# Patient Record
Sex: Male | Born: 1940 | Race: White | Hispanic: No | Marital: Married | Smoking: Never smoker
Health system: Southern US, Community
[De-identification: ages and names within clinical notes are randomized; demographics above are authoritative.]

---

## 2000-04-17 ENCOUNTER — Encounter: Payer: Self-pay | Admitting: Family Medicine

## 2000-04-17 ENCOUNTER — Encounter: Admission: RE | Admit: 2000-04-17 | Discharge: 2000-04-17 | Payer: Self-pay | Admitting: Family Medicine

## 2000-11-12 ENCOUNTER — Ambulatory Visit (HOSPITAL_COMMUNITY): Admission: RE | Admit: 2000-11-12 | Discharge: 2000-11-12 | Payer: Self-pay | Admitting: Gastroenterology

## 2000-11-12 ENCOUNTER — Encounter (INDEPENDENT_AMBULATORY_CARE_PROVIDER_SITE_OTHER): Payer: Self-pay | Admitting: Specialist

## 2007-05-12 ENCOUNTER — Encounter: Admission: RE | Admit: 2007-05-12 | Discharge: 2007-05-12 | Payer: Self-pay | Admitting: Internal Medicine

## 2010-07-07 NOTE — Op Note (Signed)
[place]  Patient:    Jared Roy, Jared Roy Visit Number: [id number] MRN: [id number]          Service Type: END Location: ENDO Attending Physician:  Charna Elizabeth Dictated by:   Anselmo Rod, M.D. Proc. Date: 11/12/00 Admit Date:  11/12/2000   CC:         Talmadge Coventry, M.D.   Operative Report  PROCEDURE:  Colonoscopy with snare polypectomy x 1.  ENDOSCOPIST:  Anselmo Rod, M.D.  INSTRUMENTS USED:  Olympus video colonoscope.  INDICATIONS:  [age]-year-old white male with a history of blood in the stool.  Rule out polyps, AVMs, hemorrhoids, etc.  INFORMED CONSENT:  Informed consent was procured from the patient.  PREPROCEDURE PREPARATION:  The patient was fasted for eight hours prior to the procedure after being prepped with a bottle of magnesium citrate and a bottle of NuLytely the night prior to the procedure.  PREPROCEDURE PHYSICAL EXAMINATION:  VITAL SIGNS:  The patient had stable vital signs.  NECK:  Supple.  CHEST:  Clear to auscultation.  HEART:  S1 and S2, regular.  ABDOMEN:  Soft with normal abdominal bowel sounds.  DESCRIPTION OF PROCEDURE:  The patient was placed in the left lateral decubitus position and sedated with 30 mg of Demerol and 5 mg of Versed intravenously.  Once the patient was adequately sedated and maintained on low flow oxygen, and continuous cardiac monitoring, the Olympus video colonoscope was advanced from the rectum to the cecum without difficulty.  A small polyp was snared at the hepatic flexure.  There were a few small scattered diverticula that seemed to be in the early stages of formation.  No other abnormalities were noted.  The procedure was completed up to the cecum.  The ileocecal valve and appendiceal orifice were clearly visualized.  IMPRESSION: 1. Small polyp snared at the hepatic flexure. 2. Few early scattered diverticula.  RECOMMENDATIONS: 1. Await pathology  results. 2. High fiber diet. 3. Outpatient follow-up in the next two weeks. Dictated by:   Anselmo Rod, M.D. Attending Physician:  Charna Elizabeth DD:  11/12/00 TD:  11/12/00 Job: [id number]

## 2010-09-09 ENCOUNTER — Emergency Department (HOSPITAL_COMMUNITY): Payer: Medicare Other

## 2010-09-09 ENCOUNTER — Emergency Department (HOSPITAL_COMMUNITY)
Admission: EM | Admit: 2010-09-09 | Discharge: 2010-09-09 | Discharge status: Against Medical Advice | Payer: Medicare Other | Attending: Emergency Medicine | Admitting: Emergency Medicine

## 2010-09-09 DIAGNOSIS — F329 Major depressive disorder, single episode, unspecified: Secondary | ICD-10-CM | POA: Insufficient documentation

## 2010-09-09 DIAGNOSIS — R0602 Shortness of breath: Secondary | ICD-10-CM | POA: Insufficient documentation

## 2010-09-09 DIAGNOSIS — E039 Hypothyroidism, unspecified: Secondary | ICD-10-CM | POA: Insufficient documentation

## 2010-09-09 DIAGNOSIS — R55 Syncope and collapse: Secondary | ICD-10-CM | POA: Insufficient documentation

## 2010-09-09 DIAGNOSIS — S1093XA Contusion of unspecified part of neck, initial encounter: Secondary | ICD-10-CM | POA: Insufficient documentation

## 2010-09-09 DIAGNOSIS — R296 Repeated falls: Secondary | ICD-10-CM | POA: Insufficient documentation

## 2010-09-09 DIAGNOSIS — R32 Unspecified urinary incontinence: Secondary | ICD-10-CM | POA: Insufficient documentation

## 2010-09-09 DIAGNOSIS — S0003XA Contusion of scalp, initial encounter: Secondary | ICD-10-CM | POA: Insufficient documentation

## 2010-09-09 DIAGNOSIS — R42 Dizziness and giddiness: Secondary | ICD-10-CM | POA: Insufficient documentation

## 2010-09-09 DIAGNOSIS — F3289 Other specified depressive episodes: Secondary | ICD-10-CM | POA: Insufficient documentation

## 2010-09-09 DIAGNOSIS — E7800 Pure hypercholesterolemia, unspecified: Secondary | ICD-10-CM | POA: Insufficient documentation

## 2010-09-09 LAB — BASIC METABOLIC PANEL
CO2: 24 mEq/L (ref 19–32)
Glucose, Bld: 91 mg/dL (ref 70–99)
Potassium: 3.8 mEq/L (ref 3.5–5.1)
Sodium: 140 mEq/L (ref 135–145)

## 2010-09-09 LAB — DIFFERENTIAL
Lymphocytes Relative: 10 % — ABNORMAL LOW (ref 12–46)
Lymphs Abs: 1.3 10*3/uL (ref 0.7–4.0)
Neutrophils Relative %: 82 % — ABNORMAL HIGH (ref 43–77)

## 2010-09-09 LAB — CBC
HCT: 37.6 % — ABNORMAL LOW (ref 39.0–52.0)
MCV: 89.3 fL (ref 78.0–100.0)
RBC: 4.21 MIL/uL — ABNORMAL LOW (ref 4.22–5.81)
WBC: 12.7 10*3/uL — ABNORMAL HIGH (ref 4.0–10.5)

## 2011-04-15 ENCOUNTER — Ambulatory Visit (INDEPENDENT_AMBULATORY_CARE_PROVIDER_SITE_OTHER): Payer: Medicare Other | Admitting: Family Medicine

## 2011-04-15 DIAGNOSIS — E785 Hyperlipidemia, unspecified: Secondary | ICD-10-CM | POA: Insufficient documentation

## 2011-04-15 DIAGNOSIS — E039 Hypothyroidism, unspecified: Secondary | ICD-10-CM | POA: Insufficient documentation

## 2011-04-15 DIAGNOSIS — J45909 Unspecified asthma, uncomplicated: Secondary | ICD-10-CM

## 2011-04-15 MED ORDER — BECLOMETHASONE DIPROPIONATE 40 MCG/ACT IN AERS: 2.0000 | INHALATION_SPRAY | Freq: Two times a day (BID) | RESPIRATORY_TRACT | Status: AC

## 2011-04-15 NOTE — Progress Notes (Signed)
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  Has had good relief in past with Q-var.  Worse x 2 weeks.  No fever.  O: NAD HEENT unremarkable Chest:  Faint wheezes  A:  Chronic persistent asthma  P: RX:  Q-var 40

## 2012-01-04 ENCOUNTER — Other Ambulatory Visit: Payer: Self-pay | Admitting: Dermatology

## 2013-01-23 IMAGING — CR DG CHEST 2V
2 series · 2 of 2 positions shown · non-contrast
Comparison: Chest x-ray 09/19/2009.

CLINICAL DATA: Shortness of breath.

CHEST - 2 VIEW

[w chest pa]
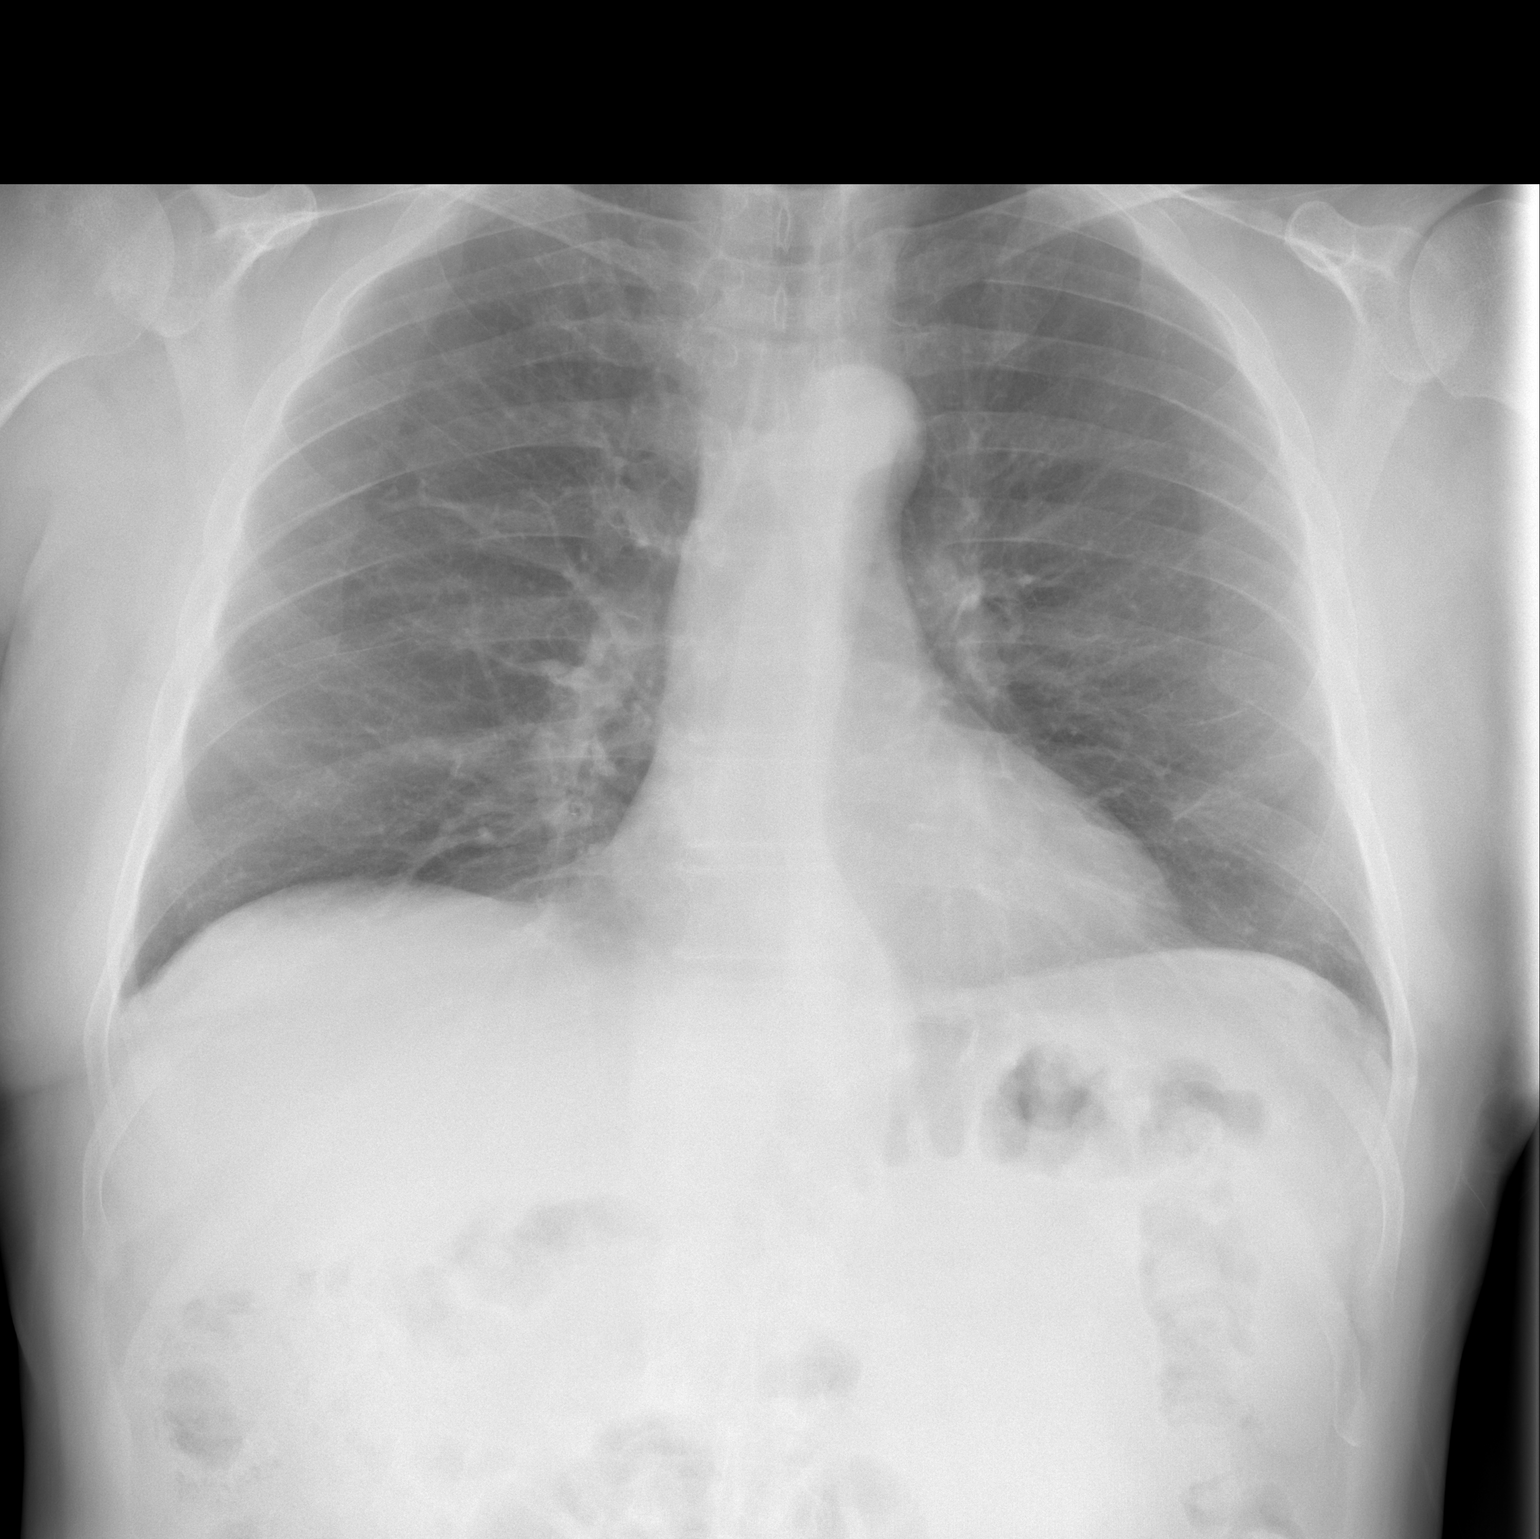

[w chest lat]
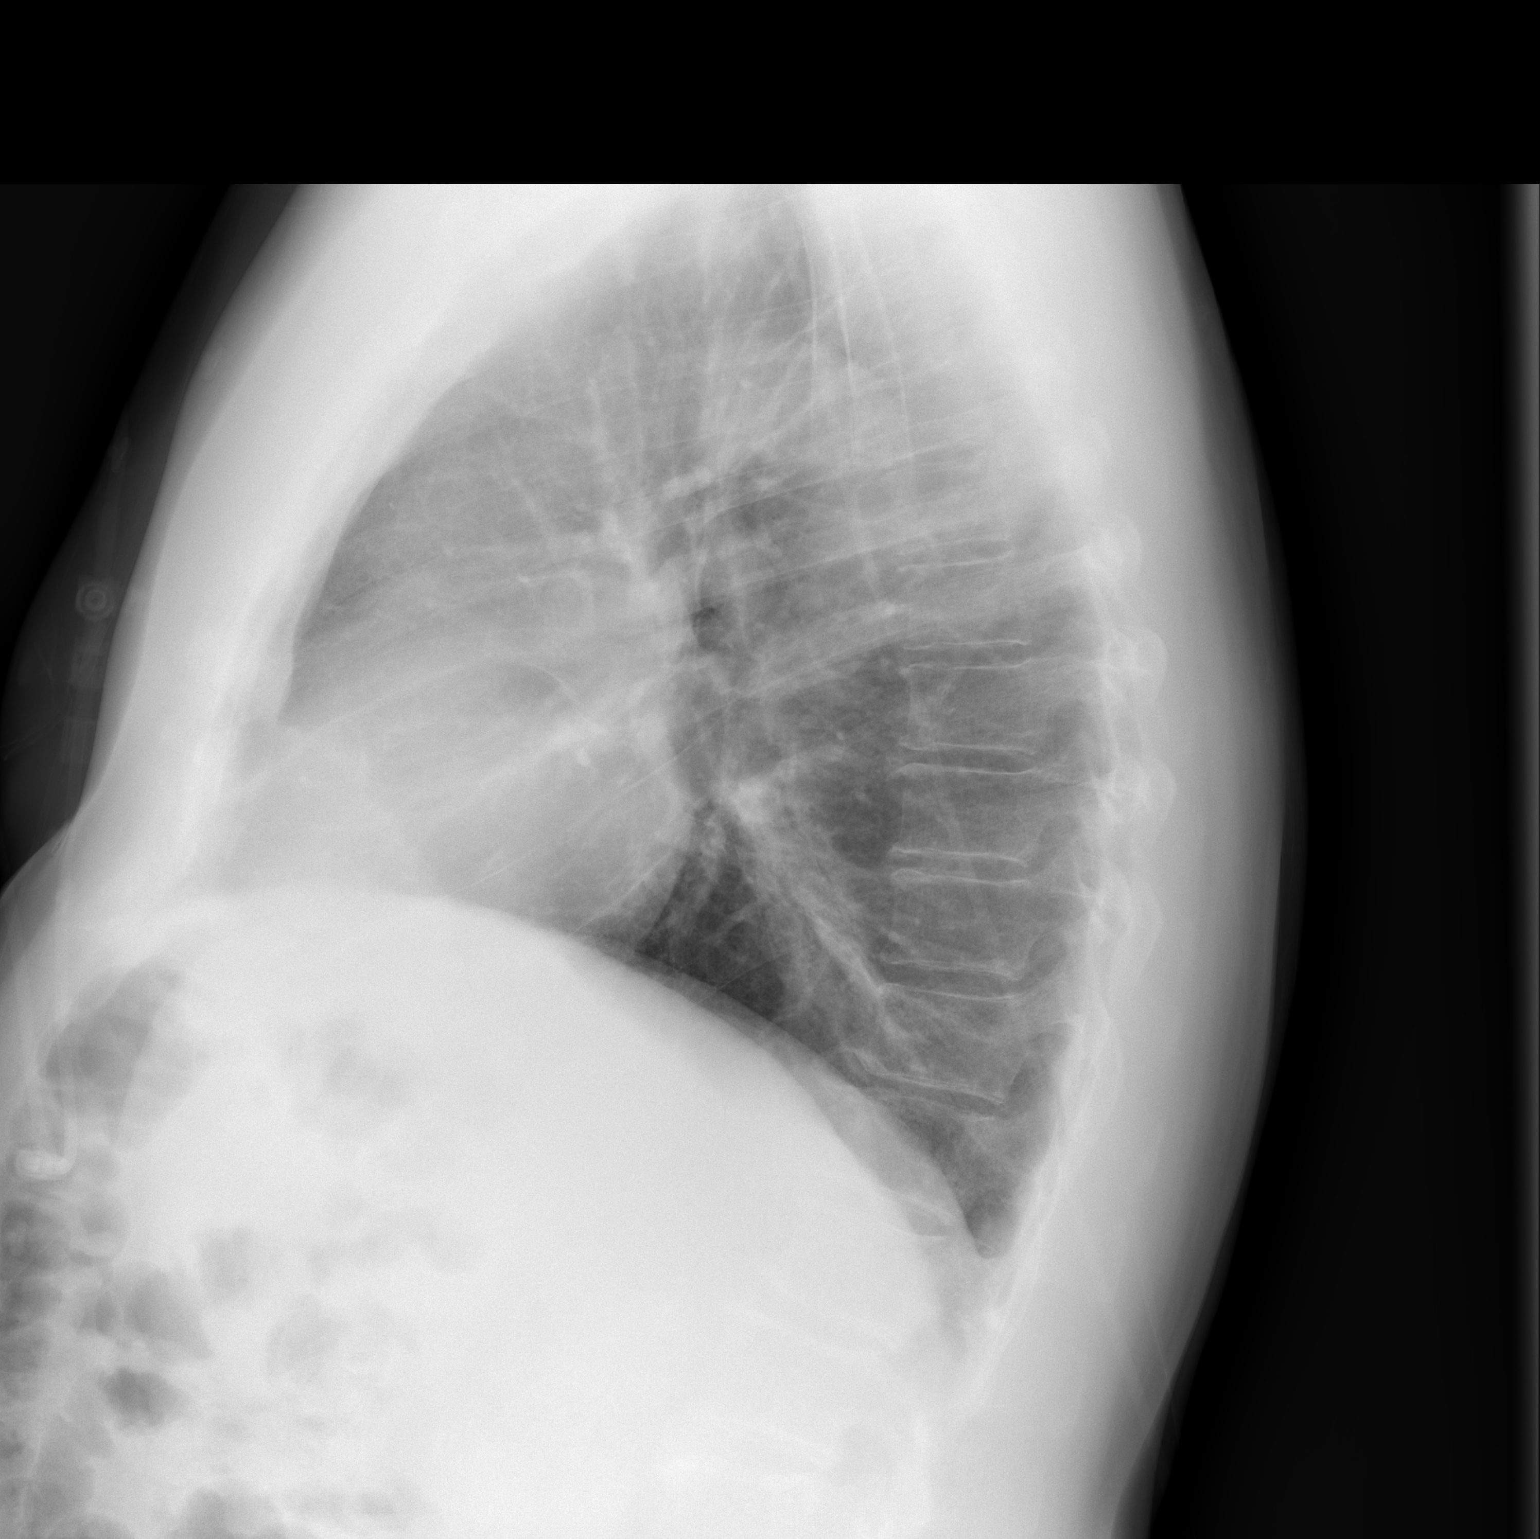

[2 of 2 positions shown; findings below may reference images not displayed]

FINDINGS: The cardiac silhouette, mediastinal and hilar contours
are within normal limits and stable.  Mild chronic bronchitic type
interstitial changes but no acute pulmonary findings.  No pleural
effusion.  The bony thorax is intact.
IMPRESSION: Mild chronic interstitial changes but no acute pulmonary findings.

## 2013-01-23 IMAGING — CT CT HEAD W/O CM
1 series · 16 of 30 positions shown, 20 images · non-contrast
Comparison: None.

CLINICAL DATA: Syncope.  Confusion.  Left frontal head injury with
scalp hematoma.

CT HEAD WITHOUT CONTRAST
TECHNIQUE: Contiguous axial images were obtained from the base of
the skull through the vertex without contrast.

[Series 2: headseq 4.8 h45s · axial · 0.43mm/px · z∈[-147,+8]mm · 16 of 36 slices shown, 20 images]
[im 2/36  brain]
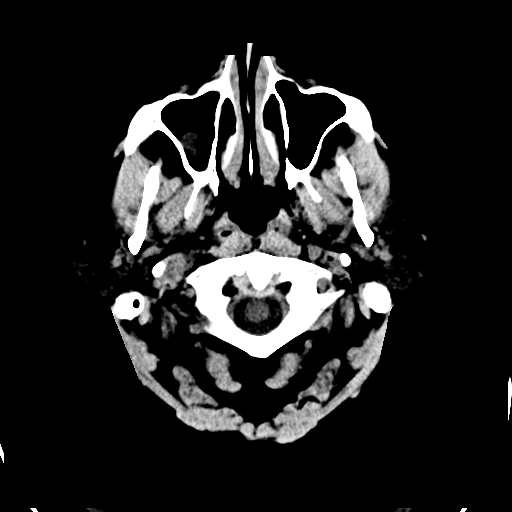
[im 2/36  bone]
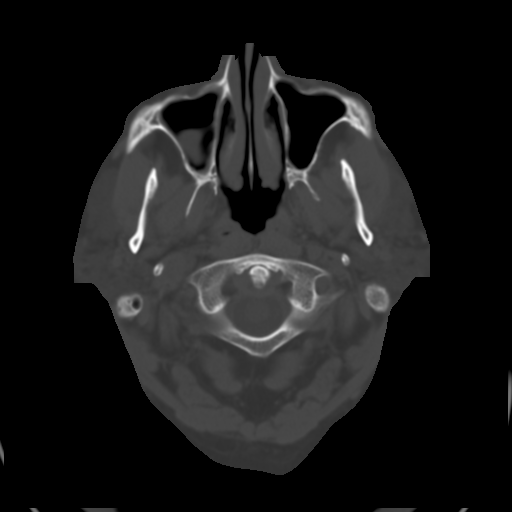
[im 4/36  brain]
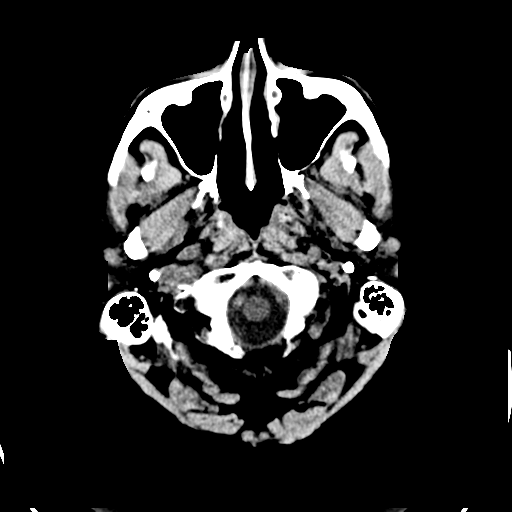
[im 7/36  brain]
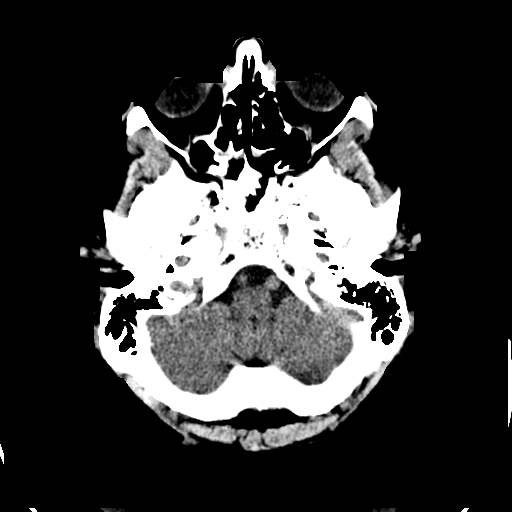
[im 9/36  brain]
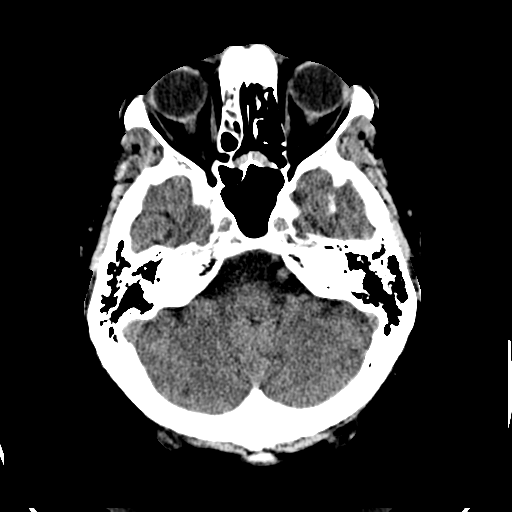
[im 10/36  brain]
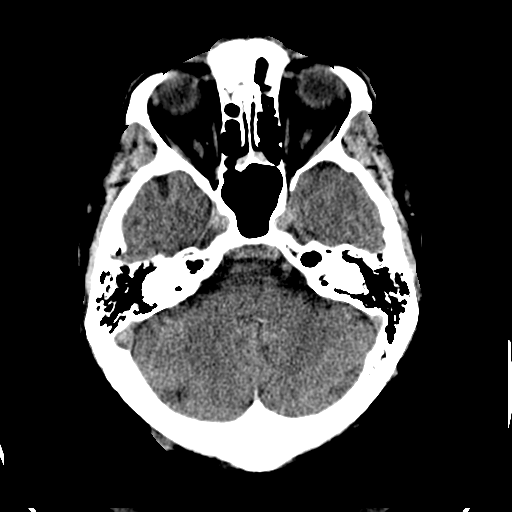
[im 10/36  bone]
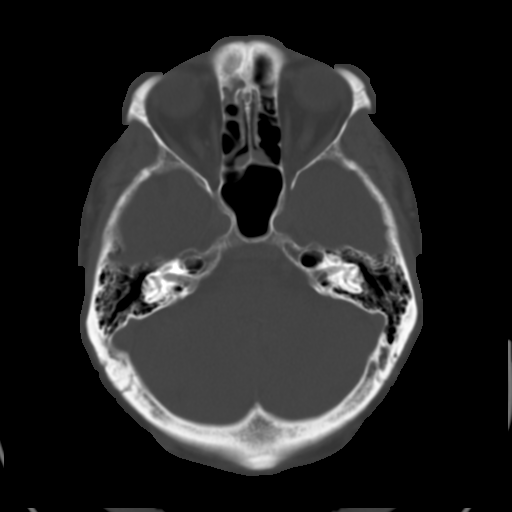
[im 13/36  brain]
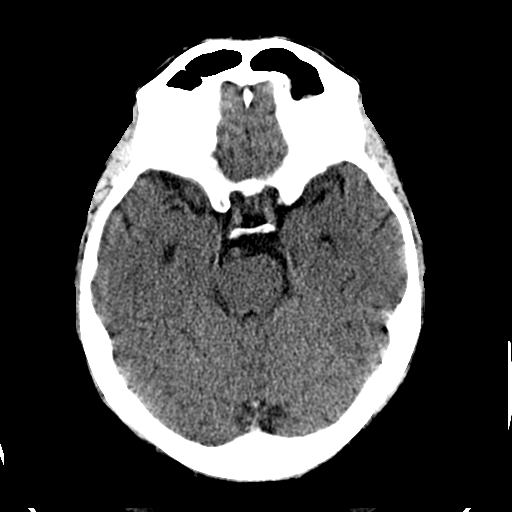
[im 15/36  brain]
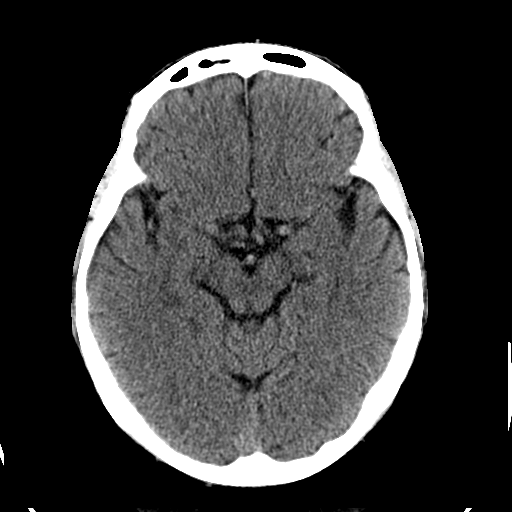
[im 17/36  brain]
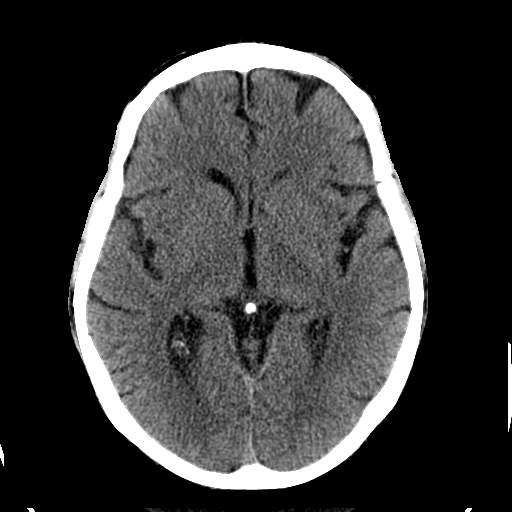
[im 19/36  brain]
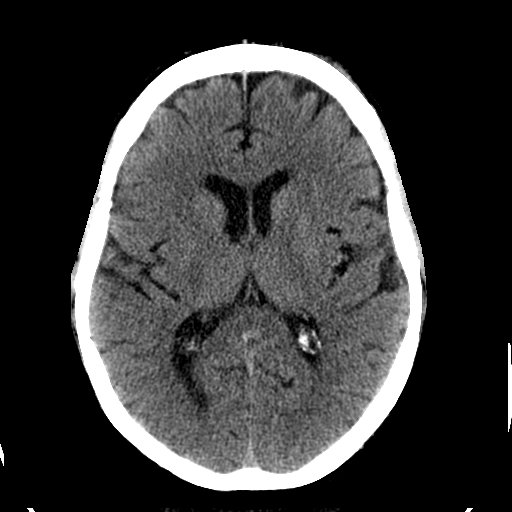
[im 19/36  bone]
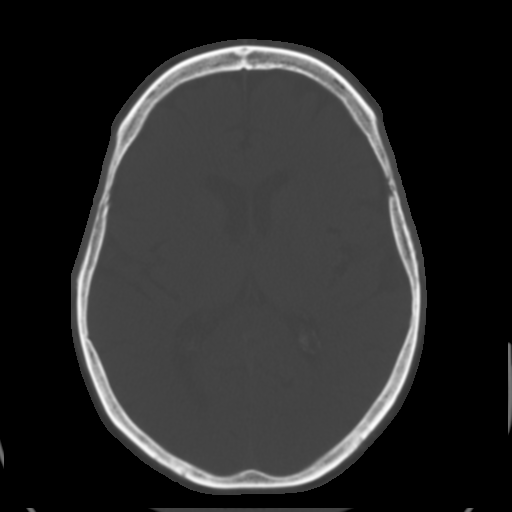
[im 21/36  brain]
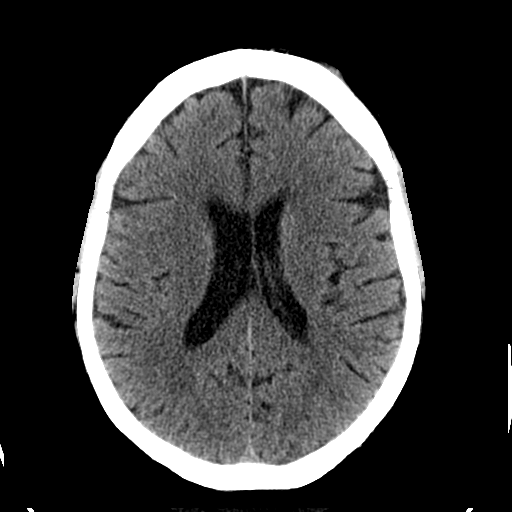
[im 23/36  brain]
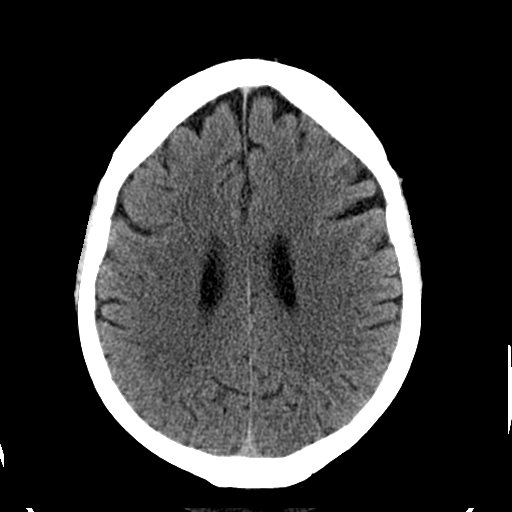
[im 26/36  brain]
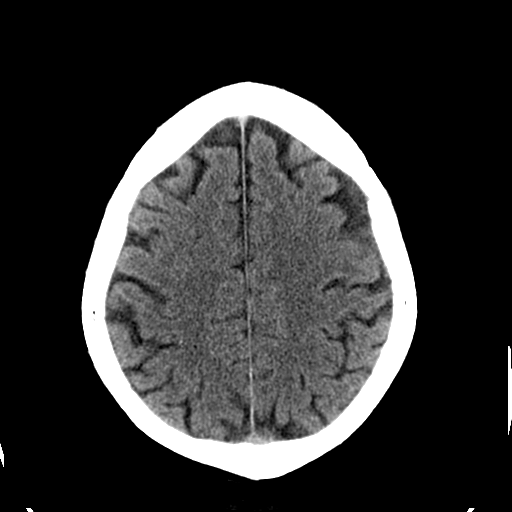
[im 27/36  brain]
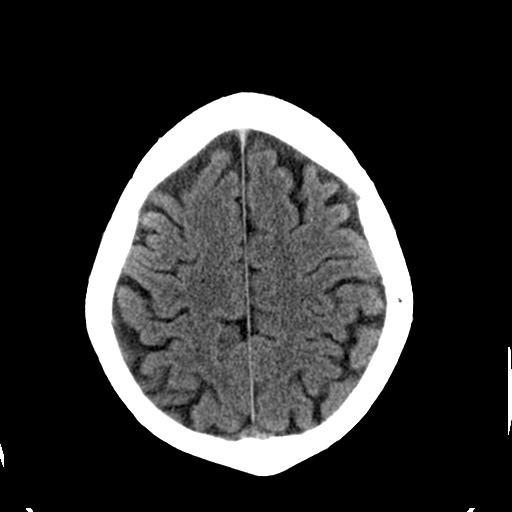
[im 27/36  bone]
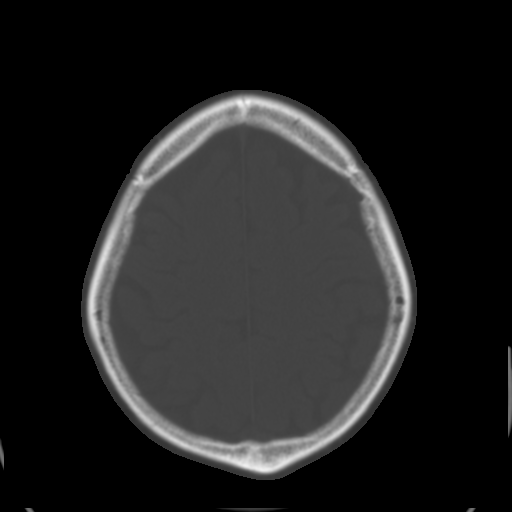
[im 29/36  brain]
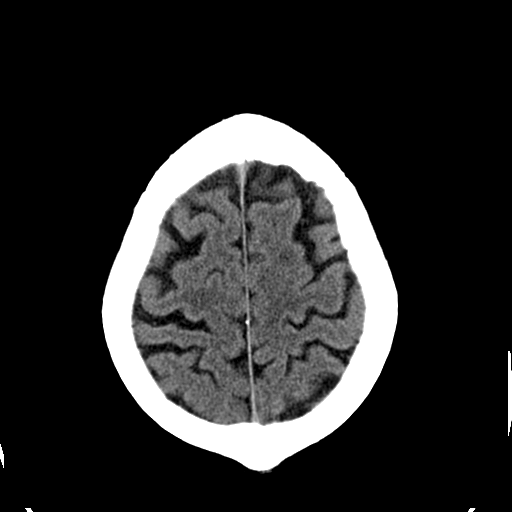
[im 32/36  brain]
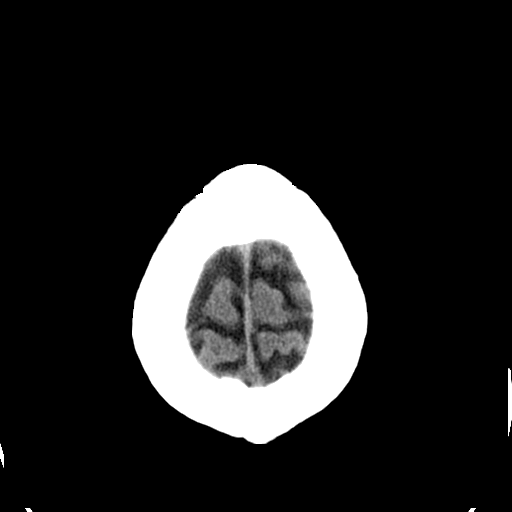
[im 34/36  brain]
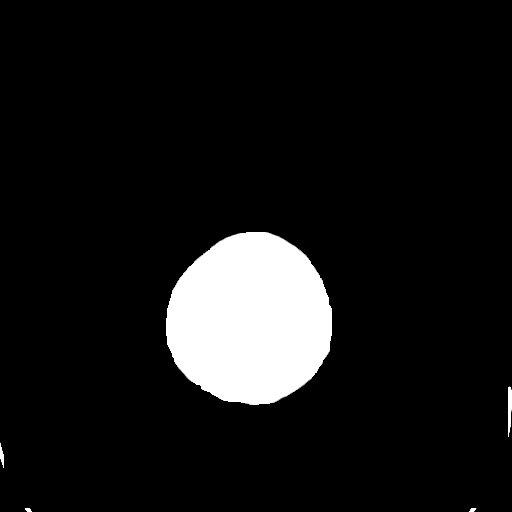

[16 of 30 positions shown; findings below may reference images not displayed]

FINDINGS: There is no evidence of intracranial hemorrhage, brain
edema or other signs of acute infarction.  There is no evidence of
intracranial mass lesion or mass effect.  No abnormal extra-axial
fluid collections are identified.

Ventricles are normal in size.  There is no evidence of skull
fracture.  A mild left frontal scalp hematoma is noted.
IMPRESSION: Mild left frontal scalp hematoma.  No evidence of skull fracture or
intracranial abnormality.

## 2013-08-24 ENCOUNTER — Other Ambulatory Visit: Payer: Self-pay | Admitting: Internal Medicine

## 2014-02-22 DIAGNOSIS — M9903 Segmental and somatic dysfunction of lumbar region: Secondary | ICD-10-CM | POA: Diagnosis not present

## 2014-02-22 DIAGNOSIS — M9902 Segmental and somatic dysfunction of thoracic region: Secondary | ICD-10-CM | POA: Diagnosis not present

## 2014-02-22 DIAGNOSIS — M9905 Segmental and somatic dysfunction of pelvic region: Secondary | ICD-10-CM | POA: Diagnosis not present

## 2014-02-22 DIAGNOSIS — M545 Low back pain: Secondary | ICD-10-CM | POA: Diagnosis not present

## 2014-03-11 DIAGNOSIS — H2513 Age-related nuclear cataract, bilateral: Secondary | ICD-10-CM | POA: Diagnosis not present

## 2014-03-11 DIAGNOSIS — H43813 Vitreous degeneration, bilateral: Secondary | ICD-10-CM | POA: Diagnosis not present

## 2014-03-11 DIAGNOSIS — H1851 Endothelial corneal dystrophy: Secondary | ICD-10-CM | POA: Diagnosis not present

## 2014-03-24 DIAGNOSIS — M9905 Segmental and somatic dysfunction of pelvic region: Secondary | ICD-10-CM | POA: Diagnosis not present

## 2014-03-24 DIAGNOSIS — M9903 Segmental and somatic dysfunction of lumbar region: Secondary | ICD-10-CM | POA: Diagnosis not present

## 2014-03-24 DIAGNOSIS — M545 Low back pain: Secondary | ICD-10-CM | POA: Diagnosis not present

## 2014-03-24 DIAGNOSIS — M9902 Segmental and somatic dysfunction of thoracic region: Secondary | ICD-10-CM | POA: Diagnosis not present

## 2014-04-02 DIAGNOSIS — E782 Mixed hyperlipidemia: Secondary | ICD-10-CM | POA: Diagnosis not present

## 2014-04-02 DIAGNOSIS — E785 Hyperlipidemia, unspecified: Secondary | ICD-10-CM | POA: Diagnosis not present

## 2014-04-02 DIAGNOSIS — E039 Hypothyroidism, unspecified: Secondary | ICD-10-CM | POA: Diagnosis not present

## 2014-04-14 DIAGNOSIS — E780 Pure hypercholesterolemia: Secondary | ICD-10-CM | POA: Diagnosis not present

## 2014-04-14 DIAGNOSIS — N529 Male erectile dysfunction, unspecified: Secondary | ICD-10-CM | POA: Diagnosis not present

## 2014-04-14 DIAGNOSIS — Z0001 Encounter for general adult medical examination with abnormal findings: Secondary | ICD-10-CM | POA: Diagnosis not present

## 2014-04-14 DIAGNOSIS — Z23 Encounter for immunization: Secondary | ICD-10-CM | POA: Diagnosis not present

## 2014-06-21 DIAGNOSIS — M9902 Segmental and somatic dysfunction of thoracic region: Secondary | ICD-10-CM | POA: Diagnosis not present

## 2014-07-05 DIAGNOSIS — H1033 Unspecified acute conjunctivitis, bilateral: Secondary | ICD-10-CM | POA: Diagnosis not present

## 2014-08-13 ENCOUNTER — Ambulatory Visit (INDEPENDENT_AMBULATORY_CARE_PROVIDER_SITE_OTHER): Payer: Medicare Other | Admitting: Family Medicine

## 2014-08-13 VITALS — BP 112/60 | HR 71 | Temp 98.8°F | Resp 17 | Ht 67.0 in | Wt 191.2 lb

## 2014-08-13 DIAGNOSIS — R350 Frequency of micturition: Secondary | ICD-10-CM

## 2014-08-13 DIAGNOSIS — R3915 Urgency of urination: Secondary | ICD-10-CM

## 2014-08-13 DIAGNOSIS — R300 Dysuria: Secondary | ICD-10-CM | POA: Diagnosis not present

## 2014-08-13 DIAGNOSIS — N410 Acute prostatitis: Secondary | ICD-10-CM | POA: Diagnosis not present

## 2014-08-13 LAB — POCT URINALYSIS DIPSTICK
Bilirubin, UA: NEGATIVE
Blood, UA: NEGATIVE
Glucose, UA: NEGATIVE
KETONES UA: NEGATIVE
LEUKOCYTES UA: NEGATIVE
NITRITE UA: NEGATIVE
PH UA: 5.5
PROTEIN UA: NEGATIVE
SPEC GRAV UA: 1.02
UROBILINOGEN UA: 0.2

## 2014-08-13 LAB — POCT UA - MICROSCOPIC ONLY
BACTERIA, U MICROSCOPIC: NEGATIVE
CRYSTALS, UR, HPF, POC: NEGATIVE
Casts, Ur, LPF, POC: NEGATIVE
EPITHELIAL CELLS, URINE PER MICROSCOPY: NEGATIVE
MUCUS UA: NEGATIVE
YEAST UA: NEGATIVE

## 2014-08-13 MED ORDER — CIPROFLOXACIN HCL 500 MG PO TABS: 500.0000 mg | ORAL_TABLET | Freq: Two times a day (BID) | ORAL | Status: AC

## 2014-08-13 NOTE — Patient Instructions (Signed)
I suspect a prostate infection.  Start Cipro, drink plenty of fluids.  If not resolved after 10 days of cipro - call me and I can extend the antibiotic for 1 week, but return if that does not completely resolve your infection. Plan on recheck PSA after infection if elevated today.   Return to the clinic or go to the nearest emergency room if any of your symptoms worsen or new symptoms occur.  Prostatitis The prostate gland is about the size and shape of a walnut. It is located just below your bladder. It produces one of the components of semen, which is made up of sperm and the fluids that help nourish and transport it out from the testicles. Prostatitis is inflammation of the prostate gland.  There are four types of prostatitis:  Acute bacterial prostatitis. This is the least common type of prostatitis. It starts quickly and usually is associated with a bladder infection, high fever, and shaking chills. It can occur at any age.  Chronic bacterial prostatitis. This is a persistent bacterial infection in the prostate. It usually develops from repeated acute bacterial prostatitis or acute bacterial prostatitis that was not properly treated. It can occur in men of any age but is most common in middle-aged men whose prostate has begun to enlarge. The symptoms are not as severe as those in acute bacterial prostatitis. Discomfort in the part of your body that is in front of your rectum and below your scrotum (perineum), lower abdomen, or in the head of your penis (glans) may represent your primary discomfort.  Chronic prostatitis (nonbacterial). This is the most common type of prostatitis. It is inflammation of the prostate gland that is not caused by a bacterial infection. The cause is unknown and may be associated with a viral infection or autoimmune disorder.  Prostatodynia (pelvic floor disorder). This is associated with increased muscular tone in the pelvis surrounding the prostate. CAUSES The  causes of bacterial prostatitis are bacterial infection. The causes of the other types of prostatitis are unknown.  SYMPTOMS  Symptoms can vary depending upon the type of prostatitis that exists. There can also be overlap in symptoms. Possible symptoms for each type of prostatitis are listed below. Acute Bacterial Prostatitis  Painful urination.  Fever or chills.  Muscle or joint pains.  Low back pain.  Low abdominal pain.  Inability to empty bladder completely. Chronic Bacterial Prostatitis, Chronic Nonbacterial Prostatitis, and Prostatodynia  Sudden urge to urinate.  Frequent urination.  Difficulty starting urine stream.  Weak urine stream.  Discharge from the urethra.  Dribbling after urination.  Rectal pain.  Pain in the testicles, penis, or tip of the penis.  Pain in the perineum.  Problems with sexual function.  Painful ejaculation.  Bloody semen. DIAGNOSIS  In order to diagnose prostatitis, your health care provider will ask about your symptoms. One or more urine samples will be taken and tested (urinalysis). If the urinalysis result is negative for bacteria, your health care provider may use a finger to feel your prostate (digital rectal exam). This exam helps your health care provider determine if your prostate is swollen and tender. It will also produce a specimen of semen that can be analyzed. TREATMENT  Treatment for prostatitis depends on the cause. If a bacterial infection is the cause, it can be treated with antibiotic medicine. In cases of chronic bacterial prostatitis, the use of antibiotics for up to 1 month or 6 weeks may be necessary. Your health care provider may instruct you  to take sitz baths to help relieve pain. A sitz bath is a bath of hot water in which your hips and buttocks are under water. This relaxes the pelvic floor muscles and often helps to relieve the pressure on your prostate. HOME CARE INSTRUCTIONS   Take all medicines as directed  by your health care provider.  Take sitz baths as directed by your health care provider. SEEK MEDICAL CARE IF:   Your symptoms get worse, not better.  You have a fever. SEEK IMMEDIATE MEDICAL CARE IF:   You have chills.  You feel nauseous or vomit.  You feel lightheaded or faint.  You are unable to urinate.  You have blood or blood clots in your urine. MAKE SURE YOU:  Understand these instructions.  Will watch your condition.  Will get help right away if you are not doing well or get worse. Document Released: 02/03/2000 Document Revised: 02/10/2013 Document Reviewed: 08/25/2012 [facility] Patient Information 2015 [place], [place]. This information is not intended to replace advice given to you by your health care provider. Make sure you discuss any questions you have with your health care provider.

## 2014-08-13 NOTE — Progress Notes (Addendum)
Subjective:  This chart was scribed for Meredith Staggers, MD by Stann Ore, Medical Scribe. This patient was seen in room 9 and the patient's care was started 10:06 AM.    Patient ID: Jared Roy, male    DOB: 1940/11/08, 74 y.o.   MRN: [id number]  HPI Jared Roy is a 74 y.o. male He complains of urinary problems that started about a week ago. He mentions that it became much worse last night. He notes having urinary frequency up to 10 times a day. He denies dysuria, but has difficulty urination having to push out the urine. He had similar symptoms about 20 years ago and it was a bladder infection. His PCP is now Dr. Selena Batten but has not seen yet. He had a prostate exam back in January and it was normal. He denies fever, nausea, vomiting, abdominal pain and back pain. He denies having taken anything to help. He has hx of asthma, hypothyroid, and hyperlipidemia.    Patient Active Problem List   Diagnosis Date Noted  . Asthma 04/15/2011  . Hypothyroid 04/15/2011  . Hyperlipidemia 04/15/2011   History reviewed. No pertinent past medical history. History reviewed. No pertinent past surgical history. No Known Allergies Prior to Admission medications   Medication Sig Start Date End Date Taking? Authorizing Provider  aspirin 81 MG tablet Take 81 mg by mouth daily.   Yes Historical Provider, MD  atorvastatin (LIPITOR) 20 MG tablet Take 20 mg by mouth every morning.   Yes Historical Provider, MD  buPROPion (WELLBUTRIN SR) 150 MG 12 hr tablet Take 150 mg by mouth daily.   Yes Historical Provider, MD  fish oil-omega-3 fatty acids 1000 MG capsule Take 1 g by mouth daily.   Yes Historical Provider, MD  levothyroxine (SYNTHROID, LEVOTHROID) 75 MCG tablet Take 35.5 mcg by mouth daily.   Yes Historical Provider, MD  beclomethasone (QVAR) 40 MCG/ACT inhaler Inhale 2 puffs into the lungs 2 (two) times daily. 04/15/11 04/14/12  Elvina Sidle, MD  Multiple Vitamins-Minerals (MULTIVITAMIN WITH  MINERALS) tablet Take 1 tablet by mouth daily.    Historical Provider, MD   History   Social History  . Marital Status: Married    Spouse Name: N/A  . Number of Children: N/A  . Years of Education: N/A   Occupational History  . Not on file.   Social History Main Topics  . Smoking status: Never Smoker   . Smokeless tobacco: Never Used  . Alcohol Use: Not on file  . Drug Use: Not on file  . Sexual Activity: Not on file   Other Topics Concern  . Not on file   Social History Narrative        Review of Systems  Constitutional: Negative for fever.  Gastrointestinal: Negative for nausea, vomiting and abdominal pain.  Genitourinary: Positive for frequency and difficulty urinating. Negative for dysuria.  Musculoskeletal: Negative for back pain.       Objective:   Physical Exam  Constitutional: He is oriented to person, place, and time. He appears well-developed and well-nourished. No distress.  HENT:  Head: Normocephalic and atraumatic.  Eyes: EOM are normal. Pupils are equal, round, and reactive to light.  Neck: Neck supple.  Cardiovascular: Normal rate, regular rhythm and normal heart sounds.   No murmur heard. Pulmonary/Chest: Effort normal and breath sounds normal. No respiratory distress.  Genitourinary:  Prostate: no apparent nodules, but was slightly tenderness, slightly boggy  Musculoskeletal: Normal range of motion.  Neurological: He is alert and  oriented to person, place, and time.  Skin: Skin is warm and dry.  Psychiatric: He has a normal mood and affect. His behavior is normal.  Nursing note and vitals reviewed.   Filed Vitals:   08/13/14 0932  BP: 112/60  Pulse: 71  Temp: 98.8 F (37.1 C)  TempSrc: Oral  Resp: 17  Height:  (1.702 m)  Weight: 191 lb 3.2 oz (86.728 kg)  SpO2: 97%   Results for orders placed or performed in visit on 08/13/14  POCT urinalysis dipstick  Result Value Ref Range   Color, UA yellow    Clarity, UA clear     Glucose, UA neg    Bilirubin, UA neg    Ketones, UA neg    Spec Grav, UA 1.020    Blood, UA neg    pH, UA 5.5    Protein, UA neg    Urobilinogen, UA 0.2    Nitrite, UA neg    Leukocytes, UA Negative Negative  POCT UA - Microscopic Only  Result Value Ref Range   WBC, Ur, HPF, POC 1-5    RBC, urine, microscopic 0-1    Bacteria, U Microscopic neg    Mucus, UA neg    Epithelial cells, urine per micros neg    Crystals, Ur, HPF, POC neg    Casts, Ur, LPF, POC neg    Yeast, UA neg          Assessment & Plan:    Jared Roy is a 74 y.o. male Urinary frequency - Plan: PSA, POCT urinalysis dipstick, POCT UA - Microscopic Only  Urinary urgency - Plan: PSA, POCT urinalysis dipstick, POCT UA - Microscopic Only  Dysuria - Plan: PSA, POCT urinalysis dipstick, POCT UA - Microscopic Only  Prostatitis, acute - Plan: ciprofloxacin (CIPRO) 500 MG tablet  Suspected acute prostatitis, without retention. Afebrile without concerning symptoms otherwise. Will attempt outpatient treatment with Cipro 500 mg twice a day for 10 days. Discussed may need longer course so if any persistent symptoms after that 10 days, call and I can extend it by at least a week. Of note PSA was obtained and noted this to be elevated consistent with prostatitis. See lab notes-plan on repeat PSA after treatment of current infection to make sure this normalizes.  Return to clinic precautions discussed  Meds ordered this encounter  Medications  . aspirin 81 MG tablet    Sig: Take 81 mg by mouth daily.  . ciprofloxacin (CIPRO) 500 MG tablet    Sig: Take 1 tablet (500 mg total) by mouth 2 (two) times daily.    Dispense:  20 tablet    Refill:  0   Patient Instructions  I suspect a prostate infection.  Start Cipro, drink plenty of fluids.  If not resolved after 10 days of cipro - call me and I can extend the antibiotic for 1 week, but return if that does not completely resolve your infection. Plan on recheck PSA after  infection if elevated today.   Return to the clinic or go to the nearest emergency room if any of your symptoms worsen or new symptoms occur.  Prostatitis The prostate gland is about the size and shape of a walnut. It is located just below your bladder. It produces one of the components of semen, which is made up of sperm and the fluids that help nourish and transport it out from the testicles. Prostatitis is inflammation of the prostate gland.  There are four types of prostatitis:  Acute bacterial prostatitis. This is the least common type of prostatitis. It starts quickly and usually is associated with a bladder infection, high fever, and shaking chills. It can occur at any age.  Chronic bacterial prostatitis. This is a persistent bacterial infection in the prostate. It usually develops from repeated acute bacterial prostatitis or acute bacterial prostatitis that was not properly treated. It can occur in men of any age but is most common in middle-aged men whose prostate has begun to enlarge. The symptoms are not as severe as those in acute bacterial prostatitis. Discomfort in the part of your body that is in front of your rectum and below your scrotum (perineum), lower abdomen, or in the head of your penis (glans) may represent your primary discomfort.  Chronic prostatitis (nonbacterial). This is the most common type of prostatitis. It is inflammation of the prostate gland that is not caused by a bacterial infection. The cause is unknown and may be associated with a viral infection or autoimmune disorder.  Prostatodynia (pelvic floor disorder). This is associated with increased muscular tone in the pelvis surrounding the prostate. CAUSES The causes of bacterial prostatitis are bacterial infection. The causes of the other types of prostatitis are unknown.  SYMPTOMS  Symptoms can vary depending upon the type of prostatitis that exists. There can also be overlap in symptoms. Possible symptoms for  each type of prostatitis are listed below. Acute Bacterial Prostatitis  Painful urination.  Fever or chills.  Muscle or joint pains.  Low back pain.  Low abdominal pain.  Inability to empty bladder completely. Chronic Bacterial Prostatitis, Chronic Nonbacterial Prostatitis, and Prostatodynia  Sudden urge to urinate.  Frequent urination.  Difficulty starting urine stream.  Weak urine stream.  Discharge from the urethra.  Dribbling after urination.  Rectal pain.  Pain in the testicles, penis, or tip of the penis.  Pain in the perineum.  Problems with sexual function.  Painful ejaculation.  Bloody semen. DIAGNOSIS  In order to diagnose prostatitis, your health care provider will ask about your symptoms. One or more urine samples will be taken and tested (urinalysis). If the urinalysis result is negative for bacteria, your health care provider may use a finger to feel your prostate (digital rectal exam). This exam helps your health care provider determine if your prostate is swollen and tender. It will also produce a specimen of semen that can be analyzed. TREATMENT  Treatment for prostatitis depends on the cause. If a bacterial infection is the cause, it can be treated with antibiotic medicine. In cases of chronic bacterial prostatitis, the use of antibiotics for up to 1 month or 6 weeks may be necessary. Your health care provider may instruct you to take sitz baths to help relieve pain. A sitz bath is a bath of hot water in which your hips and buttocks are under water. This relaxes the pelvic floor muscles and often helps to relieve the pressure on your prostate. HOME CARE INSTRUCTIONS   Take all medicines as directed by your health care provider.  Take sitz baths as directed by your health care provider. SEEK MEDICAL CARE IF:   Your symptoms get worse, not better.  You have a fever. SEEK IMMEDIATE MEDICAL CARE IF:   You have chills.  You feel nauseous or  vomit.  You feel lightheaded or faint.  You are unable to urinate.  You have blood or blood clots in your urine. MAKE SURE YOU:  Understand these instructions.  Will watch your condition.  Will  get help right away if you are not doing well or get worse. Document Released: 02/03/2000 Document Revised: 02/10/2013 Document Reviewed: 08/25/2012 [facility] Patient Information 2015 [place], [place]. This information is not intended to replace advice given to you by your health care provider. Make sure you discuss any questions you have with your health care provider.     I personally performed the services described in this documentation, which was scribed in my presence. The recorded information has been reviewed and considered, and addended by me as needed.

## 2014-08-14 LAB — PSA: PSA: 29.36 ng/mL — ABNORMAL HIGH (ref <=4.00)

## 2014-08-16 ENCOUNTER — Telehealth: Payer: Self-pay

## 2014-08-16 NOTE — Telephone Encounter (Signed)
Pt would like Dr.Greene to know he isn't much better and would like to know if he would call in some Flomax for him, also had a PSA test done and would like to know results Please call [phone]   [facility] [address]

## 2014-08-16 NOTE — Telephone Encounter (Signed)
I can read note on his lab when I call him. Please advise on Flomax Rx.

## 2014-08-17 ENCOUNTER — Telehealth: Payer: Self-pay | Admitting: *Deleted

## 2014-08-17 NOTE — Telephone Encounter (Signed)
Pt called again and states that he is using the bathroom throughout the night constantly.  He states that his sxs are not getting worse and they are not getting any better either.  The message from Dr. Neva SeatGreene was given to the pt and he understood.  However, I advised pt to come to the office if his sxs were not improving and to discuss Flomax.  Pt understood.

## 2014-08-17 NOTE — Telephone Encounter (Signed)
May take a few days of antibiotics to notice improvement. I can check into use of Flomax in acute prostatitis, but would like to look into this further before we prescribe it.  If any retention, or inability to pass urine - return to our office or ER.

## 2014-08-25 NOTE — Telephone Encounter (Signed)
Error

## 2014-09-16 DIAGNOSIS — R972 Elevated prostate specific antigen [PSA]: Secondary | ICD-10-CM | POA: Diagnosis not present

## 2014-10-07 DIAGNOSIS — E780 Pure hypercholesterolemia: Secondary | ICD-10-CM | POA: Diagnosis not present

## 2014-10-07 DIAGNOSIS — E039 Hypothyroidism, unspecified: Secondary | ICD-10-CM | POA: Diagnosis not present

## 2014-11-16 DIAGNOSIS — R972 Elevated prostate specific antigen [PSA]: Secondary | ICD-10-CM | POA: Diagnosis not present

## 2014-11-16 DIAGNOSIS — E039 Hypothyroidism, unspecified: Secondary | ICD-10-CM | POA: Diagnosis not present

## 2014-11-16 DIAGNOSIS — Z23 Encounter for immunization: Secondary | ICD-10-CM | POA: Diagnosis not present

## 2014-11-16 DIAGNOSIS — E785 Hyperlipidemia, unspecified: Secondary | ICD-10-CM | POA: Diagnosis not present

## 2015-01-07 DIAGNOSIS — M9902 Segmental and somatic dysfunction of thoracic region: Secondary | ICD-10-CM | POA: Diagnosis not present

## 2015-03-24 DIAGNOSIS — H43811 Vitreous degeneration, right eye: Secondary | ICD-10-CM | POA: Diagnosis not present

## 2015-03-24 DIAGNOSIS — H1851 Endothelial corneal dystrophy: Secondary | ICD-10-CM | POA: Diagnosis not present

## 2015-03-24 DIAGNOSIS — H2513 Age-related nuclear cataract, bilateral: Secondary | ICD-10-CM | POA: Diagnosis not present

## 2015-11-28 DIAGNOSIS — R972 Elevated prostate specific antigen [PSA]: Secondary | ICD-10-CM | POA: Diagnosis not present

## 2015-12-01 DIAGNOSIS — J209 Acute bronchitis, unspecified: Secondary | ICD-10-CM | POA: Diagnosis not present

## 2015-12-01 DIAGNOSIS — R05 Cough: Secondary | ICD-10-CM | POA: Diagnosis not present

## 2015-12-02 DIAGNOSIS — R972 Elevated prostate specific antigen [PSA]: Secondary | ICD-10-CM | POA: Diagnosis not present

## 2015-12-02 DIAGNOSIS — N400 Enlarged prostate without lower urinary tract symptoms: Secondary | ICD-10-CM | POA: Diagnosis not present
